# Patient Record
Sex: Male | Born: 1965 | Hispanic: Yes | Smoking: Never smoker
Health system: Southern US, Community
[De-identification: ages and names within clinical notes are randomized; demographics above are authoritative.]

## PROBLEM LIST (undated history)

## (undated) DIAGNOSIS — Z789 Other specified health status: Secondary | ICD-10-CM

## (undated) HISTORY — PX: NO PAST SURGERIES: SHX2092

---

## 2011-02-24 ENCOUNTER — Emergency Department (HOSPITAL_COMMUNITY): Payer: Self-pay

## 2011-02-24 ENCOUNTER — Encounter (HOSPITAL_COMMUNITY): Payer: Self-pay | Admitting: *Deleted

## 2011-02-24 ENCOUNTER — Other Ambulatory Visit: Payer: Self-pay

## 2011-02-24 ENCOUNTER — Emergency Department (HOSPITAL_COMMUNITY)
Admission: EM | Admit: 2011-02-24 | Discharge: 2011-02-24 | Disposition: A | Discharge status: Home / Self Care | Payer: Self-pay | Attending: Emergency Medicine | Admitting: Emergency Medicine

## 2011-02-24 DIAGNOSIS — S01502A Unspecified open wound of oral cavity, initial encounter: Secondary | ICD-10-CM | POA: Insufficient documentation

## 2011-02-24 DIAGNOSIS — S3981XA Other specified injuries of abdomen, initial encounter: Secondary | ICD-10-CM | POA: Insufficient documentation

## 2011-02-24 DIAGNOSIS — S42401A Unspecified fracture of lower end of right humerus, initial encounter for closed fracture: Secondary | ICD-10-CM

## 2011-02-24 DIAGNOSIS — S52023A Displaced fracture of olecranon process without intraarticular extension of unspecified ulna, initial encounter for closed fracture: Secondary | ICD-10-CM | POA: Insufficient documentation

## 2011-02-24 DIAGNOSIS — Y92410 Unspecified street and highway as the place of occurrence of the external cause: Secondary | ICD-10-CM | POA: Insufficient documentation

## 2011-02-24 DIAGNOSIS — Y998 Other external cause status: Secondary | ICD-10-CM | POA: Insufficient documentation

## 2011-02-24 DIAGNOSIS — S61412A Laceration without foreign body of left hand, initial encounter: Secondary | ICD-10-CM

## 2011-02-24 DIAGNOSIS — S0990XA Unspecified injury of head, initial encounter: Secondary | ICD-10-CM | POA: Insufficient documentation

## 2011-02-24 DIAGNOSIS — IMO0002 Reserved for concepts with insufficient information to code with codable children: Secondary | ICD-10-CM | POA: Insufficient documentation

## 2011-02-24 DIAGNOSIS — S01511A Laceration without foreign body of lip, initial encounter: Secondary | ICD-10-CM

## 2011-02-24 DIAGNOSIS — S61209A Unspecified open wound of unspecified finger without damage to nail, initial encounter: Secondary | ICD-10-CM | POA: Insufficient documentation

## 2011-02-24 DIAGNOSIS — S3991XA Unspecified injury of abdomen, initial encounter: Secondary | ICD-10-CM

## 2011-02-24 LAB — URINALYSIS, ROUTINE W REFLEX MICROSCOPIC
Bilirubin Urine: NEGATIVE
Glucose, UA: NEGATIVE mg/dL
Ketones, ur: 15 mg/dL — AB
Specific Gravity, Urine: 1.031 — ABNORMAL HIGH (ref 1.005–1.030)
pH: 5.5 (ref 5.0–8.0)

## 2011-02-24 LAB — COMPREHENSIVE METABOLIC PANEL
ALT: 67 U/L — ABNORMAL HIGH (ref 0–53)
Alkaline Phosphatase: 128 U/L — ABNORMAL HIGH (ref 39–117)
CO2: 20 mEq/L (ref 19–32)
Calcium: 9.3 mg/dL (ref 8.4–10.5)
Chloride: 103 mEq/L (ref 96–112)
GFR calc Af Amer: >90 mL/min (ref >90)
GFR calc non Af Amer: >90 mL/min (ref >90)
Glucose, Bld: 120 mg/dL — ABNORMAL HIGH (ref 70–99)
Potassium: 4.5 mEq/L (ref 3.5–5.1)
Sodium: 139 mEq/L (ref 135–145)
Total Bilirubin: 0.3 mg/dL (ref 0.3–1.2)

## 2011-02-24 LAB — CBC
HCT: 44.2 % (ref 39.0–52.0)
Hemoglobin: 16.2 g/dL (ref 13.0–17.0)
MCH: 33.7 pg (ref 26.0–34.0)
MCHC: 36.7 g/dL — ABNORMAL HIGH (ref 30.0–36.0)
RBC: 4.81 MIL/uL (ref 4.22–5.81)

## 2011-02-24 LAB — DIFFERENTIAL
Basophils Relative: 0 % (ref 0–1)
Eosinophils Absolute: 0.2 10*3/uL (ref 0.0–0.7)
Lymphs Abs: 1.7 10*3/uL (ref 0.7–4.0)
Monocytes Absolute: 0.5 10*3/uL (ref 0.1–1.0)
Monocytes Relative: 5 % (ref 3–12)

## 2011-02-24 LAB — URINE MICROSCOPIC-ADD ON

## 2011-02-24 MED ORDER — FENTANYL CITRATE 0.05 MG/ML IJ SOLN
100.0000 ug | Freq: Once | INTRAMUSCULAR | Status: AC
  Administered 2011-02-24: 100 ug via INTRAVENOUS
  Filled 2011-02-24: qty 2

## 2011-02-24 MED ORDER — SODIUM CHLORIDE 0.9 % IV SOLN
INTRAVENOUS | Status: DC
  Administered 2011-02-24: 14:00:00 via INTRAVENOUS

## 2011-02-24 MED ORDER — IOHEXOL 300 MG/ML  SOLN
100.0000 mL | Freq: Once | INTRAMUSCULAR | Status: AC | PRN
  Administered 2011-02-24: 100 mL via INTRAVENOUS

## 2011-02-24 MED ORDER — TETANUS-DIPHTH-ACELL PERTUSSIS 5-2.5-18.5 LF-MCG/0.5 IM SUSP
0.5000 mL | Freq: Once | INTRAMUSCULAR | Status: AC
  Administered 2011-02-24: 0.5 mL via INTRAMUSCULAR
  Filled 2011-02-24: qty 0.5

## 2011-02-24 MED ORDER — OXYCODONE-ACETAMINOPHEN 5-325 MG PO TABS: 2.0000 | ORAL_TABLET | ORAL | Status: AC | PRN
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
## 2011-02-24 NOTE — ED Provider Notes (Signed)
I repaired the patient's lacerations to the extensor surface of his fingers. Wounds were cleaned with extensive syringe irrigation and explored with no gross evidence for tendon rupture. He has full strength and ROM with flex/ext in all fingers. Neurovascularly intact distally.  LACERATION REPAIR Performed by: Grant Fontana Authorized by: Grant Fontana Consent: Verbal consent obtained. Risks and benefits: risks, benefits and alternatives were discussed Consent given by: patient Patient identity confirmed: provided demographic data Prepped and Draped in normal sterile fashion Wound explored  Laceration Location: index, middle, long, and little fingers of left hand  Laceration Length: 2.5 cm, 1 cm, 3.5 cm, 2 cm  No Foreign Bodies seen or palpated  Anesthesia: digital block  Local anesthetic: lidocaine 2%  without epinephrine  Anesthetic total: 3 ml per finger  Irrigation method: syringe Amount of cleaning: standard  Skin closure: 4-0 Prolene  Number of sutures: 8 index finger, 2 middle finger, 14 long finger, 8 little finger  Technique: simple interrupted  Patient tolerance: Patient tolerated the procedure well with no immediate complications.   Grant Fontana, [place] 02/24/11 1944  Grant Fontana, [place] 02/24/11 [phone]

## 2011-02-24 NOTE — ED Notes (Signed)
Resident in the room suturing pt wound.

## 2011-02-24 NOTE — ED Notes (Signed)
Attempted to obtain urine specimen. Pt unable at this time

## 2011-02-24 NOTE — ED Notes (Signed)
Chaplain called family.
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
## 2011-02-24 NOTE — Progress Notes (Signed)
Orthopedic Tech Progress Note Patient Details:  Carlos Fry [birth year] [id number]  Type of Splint: Long arm;Other (comment) (foam arm sling) Splint Location: right arm Splint Interventions: Application    Nori Poland 02/24/2011, 8:40 PM
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
## 2011-02-24 NOTE — ED Provider Notes (Signed)
Medical screening examination/treatment/procedure(s) were conducted as a shared visit with non-physician practitioner(s) and myself.  I personally evaluated the patient during the encounter  Hurman Horn, MD 02/24/11 2128

## 2011-02-24 NOTE — ED Provider Notes (Signed)
I saw and evaluated the patient, reviewed the resident's note and I agree with the findings and plan.  Hurman Horn, MD 02/24/11 2129

## 2011-02-24 NOTE — ED Notes (Signed)
Called Radiology: waiting on Lab work before they come take him to do the x-rays
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
## 2011-02-24 NOTE — Discharge Instructions (Signed)
Traumatismo contuso (Blunt Trauma) Usted ha sido evaluado por sus lesiones. Fue examinado y Mining engineer que lo asiste no Clinical cytogeneticist lesiones lo suficientemente graves como para que requiera hospitalizacin. Luego de un accidente, es comn presentar mltiples magullones y dolores musculares. Estos tienden a Product manager las primeras 24 horas, con ms rigidez y TEFL teacher las horas siguientes, que empeorarn cuando se levante de la cama la primera maana luego del accidente. A partir de all, debera comenzar a Risk manager que pase. El nivel de mejora generalmente depende de la importancia de las lesiones sufridas en el accidente. Luego del accidente, si alguna parte de su cuerpo no responde como debera, o si el dolor en alguna zona se incrementa, debe regresar al [employer] para que lo examinen nuevamente.  INSTRUCCIONES PARA EL CUIDADO DOMICILIARIO Los cuidados de rutina para las zonas doloridas deben incluir:  [employer] en las zonas doloridas cada 2 horas durante 20 minutos mientras est despierto durante los prximos 2 das.   Beber lquidos en abundancia (excluya el alcohol).   Darse una ducha caliente o tibia o tomar un bao una o dos veces por da para aumentar el flujo sanguneo en los msculos doloridos. Esto lo ayudar a IT sales professional.   Actividades segn las tolere. Levantar pesos Social research officer, government de cuello o espalda.   Utilice los medicamentos de venta libre o de prescripcin para Chief Technology Officer, Environmental health practitioner o la [place], segn se lo indique el profesional que lo asiste. No tome aspirina. Podran aumentar los hematomas o las hemorragias en caso de que existan pequeas zonas en las que esto ocurre.  SOLICITE ATENCIN MDICA DE INMEDIATO SI OBSERVA:  Entumecimiento, hormigueo, debilidad o problemas con el uso de los brazos o las piernas.   Dolor de cabeza intenso que no mejora con medicamentos.   Cambios en el control del intestino o la vejiga.   Aumento del  dolor en otras partes del cuerpo.   Dificultades respiratorias, mareos.   Nuseas, vmitos o sudoracin.   Aumento del malestar abdominal (en el vientre).   Sangre en la orina, en las heces o vmitos con [place].   Dolor en los hombros en el rea donde se ubicaran los tirantes de [place] prenda.   Sensacin de [employer] o si ha sufrido un episodio de [place].  En algunos casos no es posible identificar todas las lesiones inmediatamente despus del traumatismo. Es importante que siga controlando su enfermedad despus de la visita al servicio de Sports administrator. Si siente que no mejora, o mejora ms lentamente que lo que debera esperarse, llame a su mdico. Si siente que sus sntomas (problemas) empeoran, vuelva al Servicio de Emergencias inmediatamente. Document Released: 12/25/2004 Document Revised: 09/06/2010 [facility] Patient Information 2012 [place], [place].  RESOURCE GUIDE  Dental Problems  Patients with Medicaid: [facility] [phone] [address]                                           [phone] [address] Phone:  [phone]  Phone:  [phone]  If unable to pay or uninsured, contact:  [facility] or [facility]. to become qualified for the adult dental clinic.  Chronic Pain Problems Contact [facility]  [phone] Patients need to be referred by their primary care doctor.  Insufficient Money for Medicine Contact United Way:  call "211" or [facility] 9790333255.  No Primary Care Doctor Call [facility]  [phone] Other agencies that provide inexpensive medical care    [facility]  [phone]    [facility]  [phone]    [facility]  [phone]    [facility]  [phone]    [facility]  [phone]    [facility]  [phone]  Psychological Services [facility] Behavioral Health   [phone] [facility] Services  [phone] [facility] Mental Health   [phone] (emergency services [phone])  Substance Abuse Resources [facility]  [phone] [facility] [phone] [facility] [phone] Floydene Flock [phone] Residential & Outpatient Substance Abuse Program  [phone]  Abuse/Neglect [facility] Child Abuse Hotline 551-087-6455 [facility] Child Abuse Hotline 716-283-7686 (After Hours)  Emergency Shelter [facility] Ministries [phone]  Maternity Homes [facility] at the [place] [facility] [place] [phone] [facility] Alert Services [phone]  MRSA Hotline #:   315-795-4686    [facility] Resources  [facility]     United Way                          [facility] Dept. [address] [place]                       [address]      [address]  Blondell Reveal Phone:  [phone]                                   Phone:  [phone]                 Phone:  [phone]  [facility] Mental Health Phone:  [phone]  [facility] Child Abuse Hotline 505-640-5479 [phone] (After Hours)

## 2011-02-24 NOTE — ED Provider Notes (Signed)
The patient has had his lacerations repaired. He's been given discharge instructions. His elbow has been splinted. The CT scan of his elbow has been completed but the results are not back. The patient however can be discharged as this was done primarily for operative planning. Dr. Merlyn Lot  plans on reviewing those results and  the patient is to followup with him in his office  Celene Kras, MD 02/24/11 2111

## 2011-02-24 NOTE — ED Notes (Signed)
Pt dc' ed per interperter phone, Rx given to pt, encouraged pt to call Alice Rieger for his appointment for Monday. Pt stated understanding. Pt dc per wheelchair with family. Zero needs voiced and denies any acute pain.

## 2011-02-24 NOTE — ED Notes (Signed)
Pr return from CT, calling Carlos Fry tech now.

## 2011-02-26 ENCOUNTER — Encounter (HOSPITAL_BASED_OUTPATIENT_CLINIC_OR_DEPARTMENT_OTHER): Payer: Self-pay | Admitting: *Deleted

## 2011-02-26 ENCOUNTER — Other Ambulatory Visit: Payer: Self-pay | Admitting: Orthopedic Surgery

## 2011-02-26 NOTE — Progress Notes (Signed)
Friend came with pt-will get spanish interepretor for am

## 2011-02-27 ENCOUNTER — Encounter (HOSPITAL_BASED_OUTPATIENT_CLINIC_OR_DEPARTMENT_OTHER): Payer: Self-pay | Admitting: Anesthesiology

## 2011-02-27 ENCOUNTER — Encounter (HOSPITAL_BASED_OUTPATIENT_CLINIC_OR_DEPARTMENT_OTHER): Payer: Self-pay | Admitting: Certified Registered Nurse Anesthetist

## 2011-02-27 ENCOUNTER — Encounter (HOSPITAL_BASED_OUTPATIENT_CLINIC_OR_DEPARTMENT_OTHER): Payer: Self-pay | Admitting: Orthopedic Surgery

## 2011-02-27 ENCOUNTER — Ambulatory Visit (HOSPITAL_BASED_OUTPATIENT_CLINIC_OR_DEPARTMENT_OTHER)
Admission: RE | Admit: 2011-02-27 | Discharge: 2011-02-27 | Disposition: A | Discharge status: Home / Self Care | Payer: Self-pay | Source: Ambulatory Visit | Attending: Orthopedic Surgery | Admitting: Orthopedic Surgery

## 2011-02-27 ENCOUNTER — Encounter (HOSPITAL_BASED_OUTPATIENT_CLINIC_OR_DEPARTMENT_OTHER)
Admission: RE | Disposition: A | Discharge status: Home / Self Care | Payer: Self-pay | Source: Ambulatory Visit | Attending: Orthopedic Surgery

## 2011-02-27 ENCOUNTER — Ambulatory Visit (HOSPITAL_BASED_OUTPATIENT_CLINIC_OR_DEPARTMENT_OTHER): Payer: Self-pay | Admitting: Anesthesiology

## 2011-02-27 DIAGNOSIS — Y998 Other external cause status: Secondary | ICD-10-CM | POA: Insufficient documentation

## 2011-02-27 DIAGNOSIS — S52023A Displaced fracture of olecranon process without intraarticular extension of unspecified ulna, initial encounter for closed fracture: Secondary | ICD-10-CM | POA: Insufficient documentation

## 2011-02-27 DIAGNOSIS — S61209A Unspecified open wound of unspecified finger without damage to nail, initial encounter: Secondary | ICD-10-CM | POA: Insufficient documentation

## 2011-02-27 DIAGNOSIS — IMO0002 Reserved for concepts with insufficient information to code with codable children: Secondary | ICD-10-CM | POA: Insufficient documentation

## 2011-02-27 HISTORY — DX: Other specified health status: Z78.9

## 2011-02-27 HISTORY — PX: ORIF ELBOW FRACTURE: SHX5031

## 2011-02-27 HISTORY — PX: LACERATION REPAIR: SHX5284

## 2011-02-27 SURGERY — OPEN REDUCTION INTERNAL FIXATION (ORIF) ELBOW/OLECRANON FRACTURE
Anesthesia: General | Site: Finger | Laterality: Right | Wound class: Clean

## 2011-02-27 MED ORDER — LABETALOL HCL 5 MG/ML IV SOLN: 5.0000 mg | INTRAVENOUS | Status: DC | PRN

## 2011-02-27 MED ORDER — PROPOFOL 10 MG/ML IV EMUL
INTRAVENOUS | Status: DC | PRN
  Administered 2011-02-27: 300 mg via INTRAVENOUS

## 2011-02-27 MED ORDER — ONDANSETRON HCL 4 MG/2ML IJ SOLN
INTRAMUSCULAR | Status: DC | PRN
  Administered 2011-02-27: 4 mg via INTRAVENOUS

## 2011-02-27 MED ORDER — OXYCODONE-ACETAMINOPHEN 5-325 MG PO TABS: ORAL_TABLET | ORAL | Status: AC

## 2011-02-27 MED ORDER — SULFAMETHOXAZOLE-TRIMETHOPRIM 800-160 MG PO TABS: 1.0000 | ORAL_TABLET | Freq: Two times a day (BID) | ORAL | Status: AC

## 2011-02-27 MED ORDER — LACTATED RINGERS IV SOLN
INTRAVENOUS | Status: DC
  Administered 2011-02-27 (×4): via INTRAVENOUS

## 2011-02-27 MED ORDER — FENTANYL CITRATE 0.05 MG/ML IJ SOLN
INTRAMUSCULAR | Status: DC | PRN
  Administered 2011-02-27 (×2): 50 ug via INTRAVENOUS
  Administered 2011-02-27: 100 ug via INTRAVENOUS
  Administered 2011-02-27: 50 ug via INTRAVENOUS
  Administered 2011-02-27: 25 ug via INTRAVENOUS
  Administered 2011-02-27: 50 ug via INTRAVENOUS
  Administered 2011-02-27: 25 ug via INTRAVENOUS
  Administered 2011-02-27: 50 ug via INTRAVENOUS
  Administered 2011-02-27: 25 ug via INTRAVENOUS

## 2011-02-27 MED ORDER — FENTANYL CITRATE 0.05 MG/ML IJ SOLN: 50.0000 ug | INTRAMUSCULAR | Status: DC | PRN

## 2011-02-27 MED ORDER — MIDAZOLAM HCL 2 MG/2ML IJ SOLN: 0.5000 mg | INTRAMUSCULAR | Status: DC | PRN

## 2011-02-27 MED ORDER — CHLORHEXIDINE GLUCONATE 4 % EX LIQD: 60.0000 mL | Freq: Once | CUTANEOUS | Status: DC

## 2011-02-27 MED ORDER — DROPERIDOL 2.5 MG/ML IJ SOLN
INTRAMUSCULAR | Status: DC | PRN
  Administered 2011-02-27: 0.625 mg via INTRAVENOUS

## 2011-02-27 MED ORDER — MIDAZOLAM HCL 5 MG/5ML IJ SOLN
INTRAMUSCULAR | Status: DC | PRN
  Administered 2011-02-27: 2 mg via INTRAVENOUS

## 2011-02-27 MED ORDER — LIDOCAINE HCL (CARDIAC) 20 MG/ML IV SOLN
INTRAVENOUS | Status: DC | PRN
  Administered 2011-02-27: 40 mg via INTRAVENOUS

## 2011-02-27 MED ORDER — METOCLOPRAMIDE HCL 5 MG/ML IJ SOLN: 10.0000 mg | Freq: Once | INTRAMUSCULAR | Status: DC | PRN

## 2011-02-27 MED ORDER — BUPIVACAINE HCL (PF) 0.25 % IJ SOLN
INTRAMUSCULAR | Status: DC | PRN
  Administered 2011-02-27: 20 mL

## 2011-02-27 MED ORDER — CEFAZOLIN SODIUM-DEXTROSE 2-3 GM-% IV SOLR
2.0000 g | Freq: Once | INTRAVENOUS | Status: AC
  Administered 2011-02-27: 2 g via INTRAVENOUS

## 2011-02-27 MED ORDER — HYDROMORPHONE HCL PF 1 MG/ML IJ SOLN
0.2500 mg | INTRAMUSCULAR | Status: DC | PRN
  Administered 2011-02-27 (×5): 0.5 mg via INTRAVENOUS

## 2011-02-27 MED ORDER — MORPHINE SULFATE 4 MG/ML IJ SOLN: 0.0500 mg/kg | INTRAMUSCULAR | Status: DC | PRN

## 2011-02-27 MED ORDER — OXYCODONE-ACETAMINOPHEN 5-325 MG PO TABS
2.0000 | ORAL_TABLET | Freq: Once | ORAL | Status: AC
  Administered 2011-02-27: 2 via ORAL

## 2011-02-27 MED ORDER — DEXAMETHASONE SODIUM PHOSPHATE 10 MG/ML IJ SOLN
INTRAMUSCULAR | Status: DC | PRN
  Administered 2011-02-27: 10 mg via INTRAVENOUS

## 2011-02-27 SURGICAL SUPPLY — 92 items
2.7 mm x 16 mm lock ×6 IMPLANT
3.5mm x 20mm lock ×3 IMPLANT
BANDAGE CONFORM 3  STR LF (GAUZE/BANDAGES/DRESSINGS) IMPLANT
BANDAGE ELASTIC 3 VELCRO ST LF (GAUZE/BANDAGES/DRESSINGS) ×6 IMPLANT
BANDAGE ELASTIC 4 VELCRO ST LF (GAUZE/BANDAGES/DRESSINGS) ×3 IMPLANT
BANDAGE GAUZE ELAST BULKY 4 IN (GAUZE/BANDAGES/DRESSINGS) ×6 IMPLANT
BIT DRILL 2.0 LNG QUCK RELEASE (BIT) ×2 IMPLANT
BIT DRILL 2.8 QUICK RELEASE (BIT) ×2 IMPLANT
BLADE MINI RND TIP GREEN BEAV (BLADE) IMPLANT
BLADE SURG 15 STRL LF DISP TIS (BLADE) ×8 IMPLANT
BLADE SURG 15 STRL SS (BLADE) ×4
BNDG COHESIVE 1X5 TAN STRL LF (GAUZE/BANDAGES/DRESSINGS) ×6 IMPLANT
BNDG ELASTIC 2 VLCR STRL LF (GAUZE/BANDAGES/DRESSINGS) IMPLANT
BNDG ESMARK 4X9 LF (GAUZE/BANDAGES/DRESSINGS) ×3 IMPLANT
BNDG PLASTER X FAST 4X5 WHT LF (CAST SUPPLIES) IMPLANT
CHLORAPREP W/TINT 26ML (MISCELLANEOUS) ×6 IMPLANT
CLOTH BEACON ORANGE TIMEOUT ST (SAFETY) ×3 IMPLANT
CORDS BIPOLAR (ELECTRODE) ×3 IMPLANT
COVER MAYO STAND STRL (DRAPES) ×6 IMPLANT
COVER TABLE BACK 60X90 (DRAPES) ×3 IMPLANT
DRAPE EXTREMITY T 121X128X90 (DRAPE) ×6 IMPLANT
DRAPE OEC MINIVIEW 54X84 (DRAPES) ×3 IMPLANT
DRAPE SURG 17X23 STRL (DRAPES) ×3 IMPLANT
DRILL 2.0 LNG QUICK RELEASE (BIT) ×3
DRILL 2.8 QUICK RELEASE (BIT) ×3
DRSG PAD ABDOMINAL 8X10 ST (GAUZE/BANDAGES/DRESSINGS) ×3 IMPLANT
GAUZE SPONGE 4X4 16PLY XRAY LF (GAUZE/BANDAGES/DRESSINGS) ×3 IMPLANT
GAUZE XEROFORM 1X8 LF (GAUZE/BANDAGES/DRESSINGS) ×6 IMPLANT
GLOVE BIO SURGEON STRL SZ7 (GLOVE) ×6 IMPLANT
GLOVE BIO SURGEON STRL SZ7.5 (GLOVE) ×6 IMPLANT
GLOVE BIOGEL PI IND STRL 7.0 (GLOVE) ×4 IMPLANT
GLOVE BIOGEL PI IND STRL 8 (GLOVE) ×4 IMPLANT
GLOVE BIOGEL PI IND STRL 8.5 (GLOVE) ×4 IMPLANT
GLOVE BIOGEL PI INDICATOR 7.0 (GLOVE) ×2
GLOVE BIOGEL PI INDICATOR 8 (GLOVE) ×2
GLOVE BIOGEL PI INDICATOR 8.5 (GLOVE) ×2
GLOVE SURG ORTHO 8.0 STRL STRW (GLOVE) ×6 IMPLANT
GOWN BRE IMP PREV XXLGXLNG (GOWN DISPOSABLE) ×12 IMPLANT
GOWN PREVENTION PLUS XLARGE (GOWN DISPOSABLE) ×6 IMPLANT
GOWN PREVENTION PLUS XXLARGE (GOWN DISPOSABLE) IMPLANT
GUIDEWIRE ORTH 6X062XTROC NS (WIRE) ×4 IMPLANT
K-WIRE .062 (WIRE) ×2
LOOP VESSEL MAXI BLUE (MISCELLANEOUS) ×3 IMPLANT
NEEDLE FILTER BLUNT 18X 1/2SAF (NEEDLE)
NEEDLE FILTER BLUNT 18X1 1/2 (NEEDLE) IMPLANT
NEEDLE HYPO 22GX1.5 SAFETY (NEEDLE) IMPLANT
NEEDLE HYPO 25X1 1.5 SAFETY (NEEDLE) ×3 IMPLANT
NS IRRIG 1000ML POUR BTL (IV SOLUTION) ×3 IMPLANT
PACK BASIN DAY SURGERY FS (CUSTOM PROCEDURE TRAY) ×3 IMPLANT
PAD CAST 3X4 CTTN HI CHSV (CAST SUPPLIES) ×6 IMPLANT
PAD CAST 4YDX4 CTTN HI CHSV (CAST SUPPLIES) IMPLANT
PADDING CAST ABS 4INX4YD NS (CAST SUPPLIES) ×1
PADDING CAST ABS COTTON 4X4 ST (CAST SUPPLIES) ×2 IMPLANT
PADDING CAST COTTON 3X4 STRL (CAST SUPPLIES) ×3
PADDING CAST COTTON 4X4 STRL (CAST SUPPLIES)
SCREW HEX NON LOCK 3.5X32MM (Screw) ×3 IMPLANT
SCREW HEXALOBE NON-LOCK 3.5X16 (Screw) ×9 IMPLANT
SCREW LOCK 12X3.5X HEXALOBE (Screw) ×2 IMPLANT
SCREW LOCK 55X3.5X HEXALOBE (Screw) ×2 IMPLANT
SCREW LOCKING 3.5X10MM (Screw) ×3 IMPLANT
SCREW LOCKING 3.5X12 (Screw) ×1 IMPLANT
SCREW LOCKING 3.5X55 (Screw) ×1 IMPLANT
SCREW NON LOCKING HEX 3.5X18MM (Screw) ×6 IMPLANT
SCREW NONLOCKING 3.5X28MM (Screw) ×6 IMPLANT
SLEEVE SCD COMPRESS KNEE MED (MISCELLANEOUS) ×3 IMPLANT
SPLINT FAST PLASTER 5X30 (CAST SUPPLIES) ×10
SPLINT FNGR PLAIN END 5/8X3.25 (CAST SUPPLIES) ×2 IMPLANT
SPLINT PLASTALUME 3 1/4 (CAST SUPPLIES) ×3
SPLINT PLASTER CAST FAST 5X30 (CAST SUPPLIES) ×20 IMPLANT
SPLINT PLASTER CAST XFAST 3X15 (CAST SUPPLIES) ×20 IMPLANT
SPLINT PLASTER CAST XFAST 4X15 (CAST SUPPLIES) IMPLANT
SPLINT PLASTER XTRA FAST SET 4 (CAST SUPPLIES)
SPLINT PLASTER XTRA FASTSET 3X (CAST SUPPLIES) ×10
SPONGE GAUZE 4X4 12PLY (GAUZE/BANDAGES/DRESSINGS) ×6 IMPLANT
STAPLER VISISTAT 35W (STAPLE) ×3 IMPLANT
STOCKINETTE 4X48 STRL (DRAPES) ×6 IMPLANT
SUCTION FRAZIER TIP 10 FR DISP (SUCTIONS) IMPLANT
SUT ETHILON 2 0 FS 18 (SUTURE) ×3 IMPLANT
SUT ETHILON 3 0 PS 1 (SUTURE) IMPLANT
SUT ETHILON 4 0 PS 2 18 (SUTURE) ×6 IMPLANT
SUT MERSILENE 4 0 P 3 (SUTURE) ×3 IMPLANT
SUT VIC AB 2-0 SH 27 (SUTURE) ×2
SUT VIC AB 2-0 SH 27XBRD (SUTURE) ×4 IMPLANT
SUT VICRYL 4-0 PS2 18IN ABS (SUTURE) ×3 IMPLANT
SYR BULB 3OZ (MISCELLANEOUS) ×3 IMPLANT
SYR CONTROL 10ML LL (SYRINGE) ×3 IMPLANT
TOWEL OR 17X24 6PK STRL BLUE (TOWEL DISPOSABLE) ×6 IMPLANT
TUBE CONNECTING 20X1/4 (TUBING) ×3 IMPLANT
UNDERPAD 30X30 INCONTINENT (UNDERPADS AND DIAPERS) ×3 IMPLANT
WATER STERILE IRR 1000ML POUR (IV SOLUTION) ×3 IMPLANT
WIRE TACK PLATE PL-PTACK (WIRE) ×6 IMPLANT
olecranon plate, extended, 9 hole, rt ×3 IMPLANT

## 2011-02-27 NOTE — Anesthesia Postprocedure Evaluation (Signed)
Anesthesia Post Note  Patient: Carlos Fry  Procedure(s) Performed: Procedure(s) (LRB): OPEN REDUCTION INTERNAL FIXATION (ORIF) ELBOW/OLECRANON FRACTURE (Right) REPAIR MULTIPLE LACERATIONS (Left)  Anesthesia type: general  Patient location: PACU  Post pain: Pain level controlled  Post assessment: Patient's Cardiovascular Status Stable  Last Vitals:  Filed Vitals:   02/27/11 1545  BP: 195/93  Pulse: 103  Temp:   Resp: 19    Post vital signs: Reviewed and stable  Level of consciousness: sedated  Complications: No apparent anesthesia complications

## 2011-02-27 NOTE — Anesthesia Procedure Notes (Signed)
Procedure Name: LMA Insertion Date/Time: 02/27/2011 11:24 AM Performed by: Signa Kell Pre-anesthesia Checklist: Patient identified, Emergency Drugs available, Suction available and Patient being monitored Patient Re-evaluated:Patient Re-evaluated prior to inductionOxygen Delivery Method: Circle System Utilized Preoxygenation: Pre-oxygenation with 100% oxygen Intubation Type: IV induction Ventilation: Mask ventilation without difficulty LMA: LMA inserted LMA Size: 5.0 Number of attempts: 1 Airway Equipment and Method: bite block Placement Confirmation: positive ETCO2 Tube secured with: Tape Dental Injury: Teeth and Oropharynx as per pre-operative assessment

## 2011-02-27 NOTE — Discharge Instructions (Addendum)
[facility] Instructions Hand Surgery  Wound Care: Keep your hand elevated above the level of your heart.  Do not allow it to dangle  by your side.  Keep the dressing dry and do not remove it unless your doctor advises you to do so.  He will usually change it at the time of your post-op visit.  Moving your fingers is advised to stimulate circulation but will depend on the site of your surgery.  If you have a splint applied, your doctor will advise you regarding movement.  Activity: Do not drive or operate machinery today.  Rest today and then you may return to your normal activity and work as indicated by your physician.  Diet:  Drink liquids today or eat a light diet.  You may resume a regular diet tomorrow.    General expectations: Pain for two to three days. Fingers may become slightly swollen.  Call your doctor if any of the following occur: Severe pain not relieved by pain medication. Elevated temperature. Dressing soaked with blood. Inability to move fingers. White or bluish color to fingers.[facility]  [address] [place], [place] [address] [phone]   Post Anesthesia Home Care Instructions  Activity: Get plenty of rest for the remainder of the day. A responsible adult should stay with you for 24 hours following the procedure.  For the next 24 hours, DO NOT: -Drive a car -Operate machinery -Drink alcoholic beverages -Take any medication unless instructed by your physician -Make any legal decisions or sign important papers.  Meals: Start with liquid foods such as gelatin or soup. Progress to regular foods as tolerated. Avoid greasy, spicy, heavy foods. If nausea and/or vomiting occur, drink only clear liquids until the nausea and/or vomiting subsides. Call your physician if vomiting continues.  Special Instructions/Symptoms: Your throat may feel dry or sore from the anesthesia or the breathing tube placed in your throat during surgery. If  this causes discomfort, gargle with warm salt water. The discomfort should disappear within 24 hours.   

## 2011-02-27 NOTE — Anesthesia Preprocedure Evaluation (Signed)
Anesthesia Evaluation  Patient identified by MRN, date of birth, ID band Patient awake    Reviewed: Allergy & Precautions, H&P , NPO status , Patient's Chart, lab work & pertinent test results, reviewed documented beta blocker date and time   Airway Mallampati: II TM Distance: >3 FB Neck ROM: full    Dental   Pulmonary neg pulmonary ROS,          Cardiovascular neg cardio ROS     Neuro/Psych Negative Neurological ROS  Negative Psych ROS   GI/Hepatic negative GI ROS, Neg liver ROS,   Endo/Other  Negative Endocrine ROS  Renal/GU negative Renal ROS  Genitourinary negative   Musculoskeletal   Abdominal   Peds  Hematology negative hematology ROS (+)   Anesthesia Other Findings See surgeon's H&P   Reproductive/Obstetrics negative OB ROS                           Anesthesia Physical Anesthesia Plan  ASA: II  Anesthesia Plan: General   Post-op Pain Management:    Induction: Intravenous  Airway Management Planned: LMA  Additional Equipment:   Intra-op Plan:   Post-operative Plan: Extubation in OR  Informed Consent: I have reviewed the patients History and Physical, chart, labs and discussed the procedure including the risks, benefits and alternatives for the proposed anesthesia with the patient or authorized representative who has indicated his/her understanding and acceptance.     Plan Discussed with: CRNA and Surgeon  Anesthesia Plan Comments:         Anesthesia Quick Evaluation  

## 2011-02-27 NOTE — Transfer of Care (Signed)
Immediate Anesthesia Transfer of Care Note  Patient: Carlos Fry  Procedure(s) Performed: Procedure(s) (LRB): OPEN REDUCTION INTERNAL FIXATION (ORIF) ELBOW/OLECRANON FRACTURE (Right) REPAIR MULTIPLE LACERATIONS (Left)  Patient Location: PACU  Anesthesia Type: General  Level of Consciousness: sedated  Airway & Oxygen Therapy: Patient Spontanous Breathing and Patient connected to face mask oxygen  Post-op Assessment: Report given to PACU RN and Post -op Vital signs reviewed and stable  Post vital signs: Reviewed and stable  Complications: No apparent anesthesia complications

## 2011-02-27 NOTE — Progress Notes (Signed)
Interpreter Wyvonnia Dusky for recovery at 14.30

## 2011-02-27 NOTE — Brief Op Note (Signed)
02/27/2011  2:20 PM  PATIENT:  Carlos Fry  46 y.o. male  PRE-OPERATIVE DIAGNOSIS:  right olecranon fracture, left index, ring, long, and small finger lacerations  POST-OPERATIVE DIAGNOSIS:  Right Olecranon Fracture and Lacerations of Left Index, Ring, Long, and Small Fingers  PROCEDURE:  Procedure(s) (LRB): OPEN REDUCTION INTERNAL FIXATION (ORIF) ELBOW/OLECRANON FRACTURE (Right) REPAIR MULTIPLE LACERATIONS (Left)  SURGEON:  Surgeon(s) and Role:    * Tami Ribas, MD - Primary    * Nicki Reaper, MD - Assisting  PHYSICIAN ASSISTANT:   ASSISTANTS: none   ANESTHESIA:   general  EBL:  Total I/O In: 2000 [I.V.:2000] Out: -   BLOOD ADMINISTERED:none  DRAINS: vessel loop drain left ring finger  LOCAL MEDICATIONS USED:  MARCAINE     SPECIMEN:  Source of Specimen:  left ring finger  DISPOSITION OF SPECIMEN:  micro  COUNTS:  YES  TOURNIQUET:   Total Tourniquet Time Documented: Forearm (Left) - 33 minutes Upper Arm (Right) - 103 minutes  DICTATION: .Other Dictation: Dictation Number [id number]  PLAN OF CARE: Discharge to home after PACU  PATIENT DISPOSITION:  PACU - hemodynamically stable.

## 2011-02-27 NOTE — Op Note (Signed)
Dictation [id number]
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
## 2011-03-02 LAB — WOUND CULTURE

## 2011-03-04 LAB — ANAEROBIC CULTURE: Gram Stain: NONE SEEN

## 2011-03-05 ENCOUNTER — Encounter (HOSPITAL_BASED_OUTPATIENT_CLINIC_OR_DEPARTMENT_OTHER): Payer: Self-pay | Admitting: Orthopedic Surgery

## 2018-05-31 ENCOUNTER — Observation Stay (HOSPITAL_COMMUNITY)
Admission: EM | Admit: 2018-05-31 | Discharge: 2018-06-01 | Disposition: A | Discharge status: Home / Self Care | Payer: Self-pay | Attending: Surgery | Admitting: Surgery

## 2018-05-31 DIAGNOSIS — Z1159 Encounter for screening for other viral diseases: Secondary | ICD-10-CM | POA: Insufficient documentation

## 2018-05-31 DIAGNOSIS — R9431 Abnormal electrocardiogram [ECG] [EKG]: Secondary | ICD-10-CM | POA: Insufficient documentation

## 2018-05-31 DIAGNOSIS — S31110A Laceration without foreign body of abdominal wall, right upper quadrant without penetration into peritoneal cavity, initial encounter: Principal | ICD-10-CM | POA: Insufficient documentation

## 2018-05-31 DIAGNOSIS — K4090 Unilateral inguinal hernia, without obstruction or gangrene, not specified as recurrent: Secondary | ICD-10-CM | POA: Insufficient documentation

## 2018-05-31 DIAGNOSIS — Z79899 Other long term (current) drug therapy: Secondary | ICD-10-CM | POA: Insufficient documentation

## 2018-05-31 DIAGNOSIS — D1779 Benign lipomatous neoplasm of other sites: Secondary | ICD-10-CM | POA: Insufficient documentation

## 2018-05-31 DIAGNOSIS — S31119A Laceration without foreign body of abdominal wall, unspecified quadrant without penetration into peritoneal cavity, initial encounter: Secondary | ICD-10-CM

## 2018-05-31 DIAGNOSIS — Z6841 Body Mass Index (BMI) 40.0 and over, adult: Secondary | ICD-10-CM | POA: Insufficient documentation

## 2018-05-31 LAB — CBG MONITORING, ED: Glucose-Capillary: 138 mg/dL — ABNORMAL HIGH (ref 70–99)

## 2018-05-31 MED ORDER — LIDOCAINE-EPINEPHRINE (PF) 2 %-1:200000 IJ SOLN
20.0000 mL | Freq: Once | INTRAMUSCULAR | Status: AC
  Administered 2018-06-01: 20 mL
  Filled 2018-05-31: qty 20

## 2018-05-31 MED ORDER — TETANUS-DIPHTH-ACELL PERTUSSIS 5-2.5-18.5 LF-MCG/0.5 IM SUSP
0.5000 mL | Freq: Once | INTRAMUSCULAR | Status: AC
  Administered 2018-06-01: 0.5 mL via INTRAMUSCULAR
  Filled 2018-05-31: qty 0.5
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
## 2018-05-31 NOTE — ED Notes (Signed)
All ED staff present on trauma room with appropriate ppe, prior to patient arrival to ED.

## 2018-06-01 ENCOUNTER — Emergency Department (HOSPITAL_COMMUNITY): Payer: Self-pay

## 2018-06-01 ENCOUNTER — Other Ambulatory Visit: Payer: Self-pay

## 2018-06-01 ENCOUNTER — Encounter (HOSPITAL_COMMUNITY): Payer: Self-pay | Admitting: Emergency Medicine

## 2018-06-01 ENCOUNTER — Emergency Department (HOSPITAL_COMMUNITY): Payer: Self-pay | Admitting: Anesthesiology

## 2018-06-01 ENCOUNTER — Encounter (HOSPITAL_COMMUNITY)
Admission: EM | Disposition: A | Discharge status: Home / Self Care | Payer: Self-pay | Source: Home / Self Care | Attending: Emergency Medicine

## 2018-06-01 DIAGNOSIS — S31119A Laceration without foreign body of abdominal wall, unspecified quadrant without penetration into peritoneal cavity, initial encounter: Secondary | ICD-10-CM | POA: Diagnosis present

## 2018-06-01 HISTORY — PX: LAPAROTOMY: SHX154

## 2018-06-01 LAB — BPAM FFP
Blood Product Expiration Date: 202006062359
Blood Product Expiration Date: 202006062359
ISSUE DATE / TIME: 202005232344
ISSUE DATE / TIME: 202005232344
Unit Type and Rh: 600
Unit Type and Rh: 6200

## 2018-06-01 LAB — PREPARE FRESH FROZEN PLASMA
Unit division: 0
Unit division: 0

## 2018-06-01 LAB — CBC WITH DIFFERENTIAL/PLATELET
Abs Immature Granulocytes: 0.04 10*3/uL (ref 0.00–0.07)
Basophils Absolute: 0.1 10*3/uL (ref 0.0–0.1)
Basophils Relative: 1 %
Eosinophils Absolute: 0.1 10*3/uL (ref 0.0–0.5)
Eosinophils Relative: 2 %
HCT: 42.1 % (ref 39.0–52.0)
Hemoglobin: 14.6 g/dL (ref 13.0–17.0)
Immature Granulocytes: 1 %
Lymphocytes Relative: 40 %
Lymphs Abs: 3 10*3/uL (ref 0.7–4.0)
MCH: 33.3 pg (ref 26.0–34.0)
MCHC: 34.7 g/dL (ref 30.0–36.0)
MCV: 95.9 fL (ref 80.0–100.0)
Monocytes Absolute: 0.9 10*3/uL (ref 0.1–1.0)
Monocytes Relative: 12 %
Neutro Abs: 3.4 10*3/uL (ref 1.7–7.7)
Neutrophils Relative %: 44 %
Platelets: 301 10*3/uL (ref 150–400)
RBC: 4.39 MIL/uL (ref 4.22–5.81)
RDW: 12.5 % (ref 11.5–15.5)
WBC: 7.5 10*3/uL (ref 4.0–10.5)
nRBC: 0 % (ref 0.0–0.2)

## 2018-06-01 LAB — ABO/RH: ABO/RH(D): O POS

## 2018-06-01 LAB — BASIC METABOLIC PANEL
Anion gap: 14 (ref 5–15)
BUN: 11 mg/dL (ref 6–20)
CO2: 22 mmol/L (ref 22–32)
Calcium: 9.3 mg/dL (ref 8.9–10.3)
Chloride: 102 mmol/L (ref 98–111)
Creatinine, Ser: 0.82 mg/dL (ref 0.61–1.24)
GFR calc Af Amer: >60 mL/min (ref >60)
GFR calc non Af Amer: >60 mL/min (ref >60)
Glucose, Bld: 129 mg/dL — ABNORMAL HIGH (ref 70–99)
Potassium: 2.9 mmol/L — ABNORMAL LOW (ref 3.5–5.1)
Sodium: 138 mmol/L (ref 135–145)

## 2018-06-01 LAB — BPAM RBC
Blood Product Expiration Date: 202005302359
Blood Product Expiration Date: 202006022359
ISSUE DATE / TIME: 202005232345
ISSUE DATE / TIME: 202005232345
Unit Type and Rh: 9500
Unit Type and Rh: 9500

## 2018-06-01 LAB — TYPE AND SCREEN
ABO/RH(D): O POS
Antibody Screen: NEGATIVE
Unit division: 0
Unit division: 0

## 2018-06-01 LAB — PROTIME-INR
INR: 1 (ref 0.8–1.2)
Prothrombin Time: 12.6 seconds (ref 11.4–15.2)

## 2018-06-01 LAB — TROPONIN I
Troponin I: 0.03 ng/mL (ref <0.03)
Troponin I: 0.03 ng/mL (ref <0.03)

## 2018-06-01 LAB — SARS CORONAVIRUS 2 BY RT PCR (HOSPITAL ORDER, PERFORMED IN ~~LOC~~ HOSPITAL LAB): SARS Coronavirus 2: NEGATIVE

## 2018-06-01 SURGERY — LAPAROTOMY, EXPLORATORY
Anesthesia: General | Site: Abdomen

## 2018-06-01 MED ORDER — ONDANSETRON HCL 4 MG/2ML IJ SOLN
INTRAMUSCULAR | Status: AC
  Filled 2018-06-01: qty 2

## 2018-06-01 MED ORDER — FENTANYL CITRATE (PF) 100 MCG/2ML IJ SOLN: 25.0000 ug | INTRAMUSCULAR | Status: DC | PRN

## 2018-06-01 MED ORDER — LIDOCAINE 2% (20 MG/ML) 5 ML SYRINGE
INTRAMUSCULAR | Status: DC | PRN
  Administered 2018-06-01: 100 mg via INTRAVENOUS

## 2018-06-01 MED ORDER — OXYCODONE HCL 5 MG PO TABS: 5.0000 mg | ORAL_TABLET | Freq: Four times a day (QID) | ORAL | 0 refills | Status: AC | PRN

## 2018-06-01 MED ORDER — SODIUM CHLORIDE 0.9% FLUSH: 3.0000 mL | INTRAVENOUS | Status: DC | PRN

## 2018-06-01 MED ORDER — BUPIVACAINE-EPINEPHRINE 0.25% -1:200000 IJ SOLN
INTRAMUSCULAR | Status: DC | PRN
  Administered 2018-06-01: 20 mL

## 2018-06-01 MED ORDER — MIDAZOLAM HCL 2 MG/2ML IJ SOLN
INTRAMUSCULAR | Status: AC
  Filled 2018-06-01: qty 2

## 2018-06-01 MED ORDER — SODIUM CHLORIDE 0.9 % IV BOLUS
1000.0000 mL | Freq: Once | INTRAVENOUS | Status: AC
  Administered 2018-06-01: 03:00:00 1000 mL via INTRAVENOUS

## 2018-06-01 MED ORDER — ONDANSETRON 4 MG PO TBDP: 4.0000 mg | ORAL_TABLET | Freq: Four times a day (QID) | ORAL | Status: DC | PRN

## 2018-06-01 MED ORDER — ONDANSETRON 4 MG PO TBDP: 4.0000 mg | ORAL_TABLET | Freq: Four times a day (QID) | ORAL | 0 refills | Status: AC | PRN

## 2018-06-01 MED ORDER — 0.9 % SODIUM CHLORIDE (POUR BTL) OPTIME
TOPICAL | Status: DC | PRN
  Administered 2018-06-01 (×2): 1000 mL

## 2018-06-01 MED ORDER — OXYCODONE HCL 5 MG PO TABS: 5.0000 mg | ORAL_TABLET | ORAL | Status: DC | PRN

## 2018-06-01 MED ORDER — SUGAMMADEX SODIUM 200 MG/2ML IV SOLN
INTRAVENOUS | Status: DC | PRN
  Administered 2018-06-01: 200 mg via INTRAVENOUS

## 2018-06-01 MED ORDER — PROPOFOL 10 MG/ML IV BOLUS
INTRAVENOUS | Status: AC
  Filled 2018-06-01: qty 20

## 2018-06-01 MED ORDER — PHENYLEPHRINE 40 MCG/ML (10ML) SYRINGE FOR IV PUSH (FOR BLOOD PRESSURE SUPPORT)
PREFILLED_SYRINGE | INTRAVENOUS | Status: AC
  Filled 2018-06-01: qty 10

## 2018-06-01 MED ORDER — POTASSIUM CHLORIDE 10 MEQ/100ML IV SOLN
10.0000 meq | INTRAVENOUS | Status: AC
  Administered 2018-06-01 (×2): 10 meq via INTRAVENOUS
  Filled 2018-06-01 (×3): qty 100

## 2018-06-01 MED ORDER — PROPOFOL 10 MG/ML IV BOLUS
INTRAVENOUS | Status: DC | PRN
  Administered 2018-06-01: 160 mg via INTRAVENOUS

## 2018-06-01 MED ORDER — POTASSIUM CHLORIDE 10 MEQ/100ML IV SOLN
10.0000 meq | Freq: Once | INTRAVENOUS | Status: AC
  Administered 2018-06-01: 06:00:00 10 meq via INTRAVENOUS

## 2018-06-01 MED ORDER — LACTATED RINGERS IV SOLN
INTRAVENOUS | Status: DC | PRN
  Administered 2018-06-01: 04:00:00 via INTRAVENOUS

## 2018-06-01 MED ORDER — SUCCINYLCHOLINE CHLORIDE 20 MG/ML IJ SOLN
INTRAMUSCULAR | Status: DC | PRN
  Administered 2018-06-01: 120 mg via INTRAVENOUS

## 2018-06-01 MED ORDER — SODIUM CHLORIDE 0.9 % IV SOLN: 250.0000 mL | INTRAVENOUS | Status: DC | PRN

## 2018-06-01 MED ORDER — KETOROLAC TROMETHAMINE 30 MG/ML IJ SOLN
INTRAMUSCULAR | Status: DC | PRN
  Administered 2018-06-01: 30 mg via INTRAVENOUS

## 2018-06-01 MED ORDER — LIDOCAINE 2% (20 MG/ML) 5 ML SYRINGE
INTRAMUSCULAR | Status: AC
  Filled 2018-06-01: qty 5

## 2018-06-01 MED ORDER — MIDAZOLAM HCL 5 MG/5ML IJ SOLN
INTRAMUSCULAR | Status: DC | PRN
  Administered 2018-06-01: 2 mg via INTRAVENOUS

## 2018-06-01 MED ORDER — FENTANYL CITRATE (PF) 250 MCG/5ML IJ SOLN
INTRAMUSCULAR | Status: AC
  Filled 2018-06-01: qty 5

## 2018-06-01 MED ORDER — DEXAMETHASONE SODIUM PHOSPHATE 10 MG/ML IJ SOLN
INTRAMUSCULAR | Status: AC
  Filled 2018-06-01: qty 1

## 2018-06-01 MED ORDER — ONDANSETRON HCL 4 MG/2ML IJ SOLN: 4.0000 mg | Freq: Four times a day (QID) | INTRAMUSCULAR | Status: DC | PRN

## 2018-06-01 MED ORDER — DEXAMETHASONE SODIUM PHOSPHATE 10 MG/ML IJ SOLN
INTRAMUSCULAR | Status: DC | PRN
  Administered 2018-06-01: 4 mg via INTRAVENOUS

## 2018-06-01 MED ORDER — SUCCINYLCHOLINE CHLORIDE 200 MG/10ML IV SOSY
PREFILLED_SYRINGE | INTRAVENOUS | Status: AC
  Filled 2018-06-01: qty 10

## 2018-06-01 MED ORDER — IOHEXOL 300 MG/ML  SOLN
100.0000 mL | Freq: Once | INTRAMUSCULAR | Status: AC | PRN
  Administered 2018-06-01: 02:00:00 100 mL via INTRAVENOUS

## 2018-06-01 MED ORDER — PROMETHAZINE HCL 25 MG/ML IJ SOLN: 6.2500 mg | INTRAMUSCULAR | Status: DC | PRN

## 2018-06-01 MED ORDER — MORPHINE SULFATE (PF) 2 MG/ML IV SOLN: 2.0000 mg | INTRAVENOUS | Status: DC | PRN

## 2018-06-01 MED ORDER — SODIUM CHLORIDE 0.9% FLUSH: 3.0000 mL | Freq: Two times a day (BID) | INTRAVENOUS | Status: DC

## 2018-06-01 MED ORDER — BUPIVACAINE-EPINEPHRINE (PF) 0.25% -1:200000 IJ SOLN
INTRAMUSCULAR | Status: AC
  Filled 2018-06-01: qty 30

## 2018-06-01 MED ORDER — FENTANYL CITRATE (PF) 100 MCG/2ML IJ SOLN
INTRAMUSCULAR | Status: DC | PRN
  Administered 2018-06-01: 100 ug via INTRAVENOUS

## 2018-06-01 MED ORDER — ONDANSETRON HCL 4 MG/2ML IJ SOLN
INTRAMUSCULAR | Status: DC | PRN
  Administered 2018-06-01: 4 mg via INTRAVENOUS

## 2018-06-01 MED ORDER — KETOROLAC TROMETHAMINE 30 MG/ML IJ SOLN
INTRAMUSCULAR | Status: AC
  Filled 2018-06-01: qty 1

## 2018-06-01 MED ORDER — CEFAZOLIN SODIUM-DEXTROSE 1-4 GM/50ML-% IV SOLN
1.0000 g | Freq: Once | INTRAVENOUS | Status: AC
  Administered 2018-06-01: 03:00:00 1 g via INTRAVENOUS
  Filled 2018-06-01: qty 50

## 2018-06-01 MED ORDER — OXYCODONE HCL 5 MG PO TABS: 10.0000 mg | ORAL_TABLET | ORAL | Status: DC | PRN

## 2018-06-01 MED ORDER — ROCURONIUM BROMIDE 50 MG/5ML IV SOSY
PREFILLED_SYRINGE | INTRAVENOUS | Status: DC | PRN
  Administered 2018-06-01: 30 mg via INTRAVENOUS

## 2018-06-01 SURGICAL SUPPLY — 46 items
BLADE CLIPPER SURG (BLADE) IMPLANT
CANISTER SUCT 3000ML PPV (MISCELLANEOUS) ×3 IMPLANT
CHLORAPREP W/TINT 26ML (MISCELLANEOUS) ×3 IMPLANT
COVER SURGICAL LIGHT HANDLE (MISCELLANEOUS) ×3 IMPLANT
COVER WAND RF STERILE (DRAPES) ×3 IMPLANT
DRAPE LAPAROSCOPIC ABDOMINAL (DRAPES) ×3 IMPLANT
DRAPE WARM FLUID 44X44 (DRAPE) ×3 IMPLANT
DRSG OPSITE POSTOP 4X10 (GAUZE/BANDAGES/DRESSINGS) IMPLANT
DRSG OPSITE POSTOP 4X6 (GAUZE/BANDAGES/DRESSINGS) ×3 IMPLANT
DRSG OPSITE POSTOP 4X8 (GAUZE/BANDAGES/DRESSINGS) IMPLANT
ELECT BLADE 6.5 EXT (BLADE) IMPLANT
ELECT CAUTERY BLADE 6.4 (BLADE) ×3 IMPLANT
ELECT REM PT RETURN 9FT ADLT (ELECTROSURGICAL) ×3
ELECTRODE REM PT RTRN 9FT ADLT (ELECTROSURGICAL) ×1 IMPLANT
GLOVE BIO SURGEON STRL SZ7 (GLOVE) ×3 IMPLANT
GLOVE BIOGEL PI IND STRL 7.5 (GLOVE) ×1 IMPLANT
GLOVE BIOGEL PI INDICATOR 7.5 (GLOVE) ×2
GLOVE INDICATOR 7.5 STRL GRN (GLOVE) ×3 IMPLANT
GLOVE SURG SS PI 7.0 STRL IVOR (GLOVE) ×3 IMPLANT
GOWN STRL REUS W/ TWL LRG LVL3 (GOWN DISPOSABLE) ×2 IMPLANT
GOWN STRL REUS W/TWL LRG LVL3 (GOWN DISPOSABLE) ×4
KIT BASIN OR (CUSTOM PROCEDURE TRAY) ×3 IMPLANT
KIT TURNOVER KIT B (KITS) ×3 IMPLANT
LIGASURE IMPACT 36 18CM CVD LR (INSTRUMENTS) IMPLANT
NEEDLE HYPO 25GX1X1/2 BEV (NEEDLE) ×3 IMPLANT
NS IRRIG 1000ML POUR BTL (IV SOLUTION) ×6 IMPLANT
PACK GENERAL/GYN (CUSTOM PROCEDURE TRAY) ×3 IMPLANT
PAD ARMBOARD 7.5X6 YLW CONV (MISCELLANEOUS) ×3 IMPLANT
PENCIL SMOKE EVACUATOR (MISCELLANEOUS) ×3 IMPLANT
SPECIMEN JAR LARGE (MISCELLANEOUS) IMPLANT
SPONGE LAP 18X18 RF (DISPOSABLE) IMPLANT
STAPLER VISISTAT 35W (STAPLE) ×3 IMPLANT
SUCTION POOLE TIP (SUCTIONS) ×3 IMPLANT
SUT NOVA 1 T20/GS 25DT (SUTURE) ×3 IMPLANT
SUT PDS AB 1 TP1 96 (SUTURE) ×6 IMPLANT
SUT SILK 2 0 SH CR/8 (SUTURE) ×6 IMPLANT
SUT SILK 2 0 TIES 10X30 (SUTURE) ×3 IMPLANT
SUT SILK 3 0 SH CR/8 (SUTURE) ×3 IMPLANT
SUT SILK 3 0 TIES 10X30 (SUTURE) ×3 IMPLANT
SUT VIC AB 2-0 SH 27 (SUTURE) ×2
SUT VIC AB 2-0 SH 27X BRD (SUTURE) ×1 IMPLANT
SUT VIC AB 3-0 SH 18 (SUTURE) IMPLANT
SYRINGE CONTROL L 12CC (SYRINGE) ×3 IMPLANT
TOWEL OR 17X26 10 PK STRL BLUE (TOWEL DISPOSABLE) ×3 IMPLANT
TRAY FOLEY MTR SLVR 16FR STAT (SET/KITS/TRAYS/PACK) IMPLANT
YANKAUER SUCT BULB TIP NO VENT (SUCTIONS) IMPLANT

## 2018-06-01 NOTE — Anesthesia Postprocedure Evaluation (Signed)
Anesthesia Post Note  Patient: Carlos Fry  Procedure(s) Performed: EXPLORATORY LAPAROTOMY REPAIR OF ABDOMINAL  WOUND (N/A Abdomen)     Patient location during evaluation: PACU Anesthesia Type: General Level of consciousness: sedated Pain management: pain level controlled Vital Signs Assessment: post-procedure vital signs reviewed and stable Respiratory status: spontaneous breathing and respiratory function stable Cardiovascular status: stable Postop Assessment: no apparent nausea or vomiting Anesthetic complications: no    Last Vitals:  Vitals:   06/01/18 0613 06/01/18 0617  BP: (!) 164/86 (!) 155/88  Pulse: 80 86  Resp: 16 18  Temp: 37.4 C 36.6 C  SpO2:  96%    Last Pain:  Vitals:   06/01/18 0800  TempSrc:   PainSc: 0-No pain                 Nelsy Madonna DANIEL

## 2018-06-01 NOTE — Discharge Instructions (Signed)
[facility]      [facility] °[phone] ° °OPEN ABDOMINAL SURGERY: POST OP INSTRUCTIONS ° °Always review your discharge instruction sheet given to you by the facility where your surgery was performed. ° °IF YOU HAVE DISABILITY OR FAMILY LEAVE FORMS, YOU MUST BRING THEM TO THE OFFICE FOR PROCESSING.  PLEASE DO NOT GIVE THEM TO YOUR DOCTOR. ° °1. A prescription for pain medication may be given to you upon discharge.  Take your pain medication as prescribed, if needed.  If narcotic pain medicine is not needed, then you may take acetaminophen (Tylenol) or ibuprofen (Advil) as needed. °2. Take your usually prescribed medications unless otherwise directed. °3. If you need a refill on your pain medication, please contact your pharmacy. They will contact our office to request authorization.  Prescriptions will not be filled after 5pm or on week-ends. °4. You should follow a light diet the first few days after arrival home, such as soup and crackers, pudding, etc.unless your doctor has advised otherwise. A high-fiber, low fat diet can be resumed as tolerated.   Be sure to include lots of fluids daily. Most patients will experience some swelling and bruising on the chest and neck area.  Ice packs will help.  Swelling and bruising can take several days to resolve °5. Most patients will experience some swelling and bruising in the area of the incision. Ice pack will help. Swelling and bruising can take several days to resolve..  °6. It is common to experience some constipation if taking pain medication after surgery.  Increasing fluid intake and taking a stool softener will usually help or prevent this problem from occurring.  A mild laxative (Milk of Magnesia or Miralax) should be taken according to package directions if there are no bowel movements after 48 hours. °7.  You may have steri-strips (small skin tapes) in place directly over the incision.  These strips should be left on the skin for 7-10 days.  If your  surgeon used skin glue on the incision, you may shower in 24 hours.  The glue will flake off over the next 2-3 weeks.  Any sutures or staples will be removed at the office during your follow-up visit. You may find that a light gauze bandage over your incision may keep your staples from being rubbed or pulled. You may shower and replace the bandage daily. °8. ACTIVITIES:  You may resume regular (light) daily activities beginning the next day--such as daily self-care, walking, climbing stairs--gradually increasing activities as tolerated.  You may have sexual intercourse when it is comfortable.  Refrain from any heavy lifting or straining until approved by your doctor. °a. You may drive when you no longer are taking prescription pain medication, you can comfortably wear a seatbelt, and you can safely maneuver your car and apply brakes °b. Return to Work: ___________________________________ °9. You should see your doctor in the office for a follow-up appointment approximately two weeks after your surgery.  Make sure that you call for this appointment within a day or two after you arrive home to insure a convenient appointment time. °OTHER INSTRUCTIONS:  °_____________________________________________________________ °_____________________________________________________________ ° °WHEN TO CALL YOUR DOCTOR: °1. Fever over 101.0 °2. Inability to urinate °3. Nausea and/or vomiting °4. Extreme swelling or bruising °5. Continued bleeding from incision. °6. Increased pain, redness, or drainage from the incision. °7. Difficulty swallowing or breathing °8. Muscle cramping or spasms. °9. Numbness or tingling in hands or feet or around lips. ° °The clinic staff is available to   answer your questions during regular business hours.  Please dont hesitate to call and ask to speak to one of the nurses if you have concerns.  For further questions, please visit [email or web address]  Puede quitar el apsito de panal el  28/05 Puedes ducharte. Deje correr Grayce Sessions jabonosa sobre la herida. Seque. No sumergir en agua hasta que la herida est completamente curada. No levante ms de 10 libras durante las prximas 6 semanas Llame a la oficina el 5/26 para averiguar la hora de su cita. Estamos trabajando para conseguirle una cita para que le retiren las grapas en aproximadamente 10 das. No conduzca ni maneje maquinaria pesada mientras est tomando oxicodona.   CUANDO LLAME A SU MDICO: Cristy Hilts superior a 101.0 Incapacidad para orinar Nuseas y / o vmitos Hinchazn extrema o moretones Sangrado continuo de la incisin. Aumento del dolor, enrojecimiento o drenaje de la incisin. Dificultad para tragar o respirar Calambres musculares o espasmos. Adormecimiento u hormigueo en manos o pies o alrededor [employer].     Managing Your Pain After Surgery Without Opioids    Thank you for participating in our program to help patients manage their pain after surgery without opioids. This is part of our effort to provide you with the best care possible, without exposing you or your family to the risk that opioids pose.  What pain can I expect after surgery? You can expect to have some pain after surgery. This is normal. The pain is typically worse the day after surgery, and quickly begins to get better. Many studies have found that many patients are able to manage their pain after surgery with Over-the-Counter (OTC) medications such as Tylenol and Motrin. If you have a condition that does not allow you to take Tylenol or Motrin, notify your surgical team.  How will I manage my pain? The best strategy for controlling your pain after surgery is around the clock pain control with Tylenol (acetaminophen) and Motrin (ibuprofen or Advil). Alternating these medications with each other allows you to maximize your pain control. In addition to Tylenol and Motrin, you can use heating pads or ice packs on your incisions to  help reduce your pain.  How will I alternate your regular strength over-the-counter pain medication? You will take a dose of pain medication every three hours. ; Start by taking 650 mg of Tylenol (2 pills of 325 mg) ; 3 hours later take 600 mg of Motrin (3 pills of 200 mg) ; 3 hours after taking the Motrin take 650 mg of Tylenol ; 3 hours after that take 600 mg of Motrin.   - 1 -  See example - if your first dose of Tylenol is at 12:00 PM   12:00 PM Tylenol 650 mg (2 pills of 325 mg)  3:00 PM Motrin 600 mg (3 pills of 200 mg)  6:00 PM Tylenol 650 mg (2 pills of 325 mg)  9:00 PM Motrin 600 mg (3 pills of 200 mg)  Continue alternating every 3 hours   We recommend that you follow this schedule around-the-clock for at least 3 days after surgery, or until you feel that it is no longer needed. Use the table on the last page of this handout to keep track of the medications you are taking. Important: Do not take more than 3000mg  of Tylenol or 3200mg  of Motrin in a 24-hour period. Do not take ibuprofen/Motrin if you have a history of bleeding stomach ulcers, severe kidney disease, &/or actively taking a blood thinner  What if I still have pain? If you have pain that is not controlled with the over-the-counter pain medications (Tylenol and Motrin or Advil) you might have what we call breakthrough pain. You will receive a prescription for a small amount of an opioid pain medication such as Oxycodone, Tramadol, or Tylenol with Codeine. Use these opioid pills in the first 24 hours after surgery if you have breakthrough pain. Do not take more than 1 pill every 4-6 hours.  If you still have uncontrolled pain after using all opioid pills, don't hesitate to call our staff using the number provided. We will help make sure you are managing your pain in the best way possible, and if necessary, we can provide a prescription for additional pain medication.   Day 1    Time  Name of Medication Number of  pills taken  Amount of Acetaminophen  Pain Level   Comments  AM PM       AM PM       AM PM       AM PM       AM PM       AM PM       AM PM       AM PM       Total Daily amount of Acetaminophen Do not take more than  3,000 mg per day      Day 2    Time  Name of Medication Number of pills taken  Amount of Acetaminophen  Pain Level   Comments  AM PM       AM PM       AM PM       AM PM       AM PM       AM PM       AM PM       AM PM       Total Daily amount of Acetaminophen Do not take more than  3,000 mg per day      Day 3    Time  Name of Medication Number of pills taken  Amount of Acetaminophen  Pain Level   Comments  AM PM       AM PM       AM PM       AM PM          AM PM       AM PM       AM PM       AM PM       Total Daily amount of Acetaminophen Do not take more than  3,000 mg per day      Day 4    Time  Name of Medication Number of pills taken  Amount of Acetaminophen  Pain Level   Comments  AM PM       AM PM       AM PM       AM PM       AM PM       AM PM       AM PM       AM PM       Total Daily amount of Acetaminophen Do not take more than  3,000 mg per day      Day 5    Time  Name of Medication Number of pills taken  Amount of Acetaminophen  Pain Level   Comments  AM PM  AM PM       AM PM       AM PM       AM PM       AM PM       AM PM       AM PM       Total Daily amount of Acetaminophen Do not take more than  3,000 mg per day       Day 6    Time  Name of Medication Number of pills taken  Amount of Acetaminophen  Pain Level  Comments  AM PM       AM PM       AM PM       AM PM       AM PM       AM PM       AM PM       AM PM       Total Daily amount of Acetaminophen Do not take more than  3,000 mg per day      Day 7    Time  Name of Medication Number of pills taken  Amount of Acetaminophen  Pain Level   Comments  AM PM       AM PM       AM PM       AM PM       AM PM        AM PM       AM PM       AM PM       Total Daily amount of Acetaminophen Do not take more than  3,000 mg per day        For additional information about how and where to safely dispose of unused opioid medications - [employer]  Disclaimer: This document contains information and/or instructional materials adapted from [place] for the typical patient with your condition. It does not replace medical advice from your health care provider because your experience may differ from that of the typical patient. Talk to your health care provider if you have any questions about this document, your condition or your treatment plan. Adapted from [place]

## 2018-06-01 NOTE — Progress Notes (Signed)
Patient discharging home today. Discharge instructions explained to patient and he verbalized understanding. Took all personal belongings. No further questions or concerns voiced.

## 2018-06-01 NOTE — Final Progress Note (Signed)
Patient received from PACU s/p exploratory laparotomy. Alert and and oriented x4, non English speaking. Denies pain at this time. Oriented to room and call bell.

## 2018-06-01 NOTE — Anesthesia Preprocedure Evaluation (Addendum)
Anesthesia Evaluation  Patient identified by MRN, date of birth, ID band Patient awake    Reviewed: Allergy & Precautions, NPO status , Patient's Chart, lab work & pertinent test results  History of Anesthesia Complications Negative for: history of anesthetic complications  Airway Mallampati: II  TM Distance: >3 FB Neck ROM: Full    Dental no notable dental hx. (+) Dental Advisory Given   Pulmonary neg pulmonary ROS,    Pulmonary exam normal        Cardiovascular negative cardio ROS Normal cardiovascular exam     Neuro/Psych negative neurological ROS     GI/Hepatic negative GI ROS, Neg liver ROS,   Endo/Other  Morbid obesity  Renal/GU negative Renal ROS     Musculoskeletal negative musculoskeletal ROS (+)   Abdominal   Peds  Hematology negative hematology ROS (+)   Anesthesia Other Findings Day of surgery medications reviewed with the patient.  Reproductive/Obstetrics                            Anesthesia Physical Anesthesia Plan  ASA: II and emergent  Anesthesia Plan: General   Post-op Pain Management:    Induction: Intravenous, Rapid sequence and Cricoid pressure planned  PONV Risk Score and Plan: 3 and Ondansetron, Dexamethasone and Diphenhydramine  Airway Management Planned: Oral ETT  Additional Equipment:   Intra-op Plan:   Post-operative Plan: Extubation in OR  Informed Consent: I have reviewed the patients History and Physical, chart, labs and discussed the procedure including the risks, benefits and alternatives for the proposed anesthesia with the patient or authorized representative who has indicated his/her understanding and acceptance.     Dental advisory given  Plan Discussed with: CRNA, Anesthesiologist and Surgeon  Anesthesia Plan Comments: (Interpreter was used for the interview)       Anesthesia Quick Evaluation
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
## 2018-06-01 NOTE — ED Notes (Signed)
Pt to be transferred to OR potassium still running 3 more bags to be given
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
## 2018-06-01 NOTE — Anesthesia Procedure Notes (Signed)
Procedure Name: Intubation Date/Time: 06/01/2018 4:45 AM Performed by: Inda Coke, CRNA Pre-anesthesia Checklist: Patient identified, Emergency Drugs available, Suction available and Patient being monitored Patient Re-evaluated:Patient Re-evaluated prior to induction Oxygen Delivery Method: Circle System Utilized Preoxygenation: Pre-oxygenation with 100% oxygen Induction Type: IV induction and Rapid sequence Laryngoscope Size: Mac and 4 Grade View: Grade I Tube type: Oral Tube size: 7.5 mm Number of attempts: 1 Airway Equipment and Method: Stylet and Oral airway Placement Confirmation: ETT inserted through vocal cords under direct vision,  positive ETCO2 and breath sounds checked- equal and bilateral Secured at: 22 cm Tube secured with: Tape Dental Injury: Teeth and Oropharynx as per pre-operative assessment
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
## 2018-06-01 NOTE — Plan of Care (Signed)

## 2018-06-01 NOTE — Transfer of Care (Signed)
Immediate Anesthesia Transfer of Care Note  Patient: Carlos Fry  Procedure(s) Performed: EXPLORATORY LAPAROTOMY (N/A )  Patient Location: PACU  Anesthesia Type:General  Level of Consciousness: awake and drowsy  Airway & Oxygen Therapy: Patient Spontanous Breathing and Patient connected to nasal cannula oxygen  Post-op Assessment: Report given to RN, Post -op Vital signs reviewed and stable and Patient moving all extremities X 4  Post vital signs: Reviewed and stable  Last Vitals:  Vitals Value Taken Time  BP 144/85 06/01/2018  5:31 AM  Temp    Pulse 86 06/01/2018  5:35 AM  Resp 16 06/01/2018  5:35 AM  SpO2 97 % 06/01/2018  5:35 AM  Vitals shown include unvalidated device data.  Last Pain:  Vitals:   06/01/18 0026  TempSrc:   PainSc: 0-No pain         Complications: No apparent anesthesia complications

## 2018-06-02 ENCOUNTER — Encounter (HOSPITAL_COMMUNITY): Payer: Self-pay | Admitting: Surgery

## 2018-06-03 ENCOUNTER — Encounter (HOSPITAL_BASED_OUTPATIENT_CLINIC_OR_DEPARTMENT_OTHER): Payer: Self-pay | Admitting: Orthopedic Surgery
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
